# Patient Record
Sex: Male | Born: 1985 | Race: Black or African American | Hispanic: No | Marital: Single | Smoking: Never smoker
Health system: Southern US, Community
[De-identification: ages and names within clinical notes are randomized; demographics above are authoritative.]

## PROBLEM LIST (undated history)

## (undated) DIAGNOSIS — J45909 Unspecified asthma, uncomplicated: Secondary | ICD-10-CM

---

## 2021-04-12 ENCOUNTER — Encounter (HOSPITAL_COMMUNITY): Payer: Self-pay

## 2021-04-12 ENCOUNTER — Other Ambulatory Visit: Payer: Self-pay

## 2021-04-12 ENCOUNTER — Emergency Department (HOSPITAL_COMMUNITY)
Admission: EM | Admit: 2021-04-12 | Discharge: 2021-04-12 | Disposition: A | Discharge status: Home / Self Care | Payer: BC Managed Care – PPO | Attending: Emergency Medicine | Admitting: Emergency Medicine

## 2021-04-12 ENCOUNTER — Emergency Department (HOSPITAL_COMMUNITY): Payer: BC Managed Care – PPO

## 2021-04-12 DIAGNOSIS — R030 Elevated blood-pressure reading, without diagnosis of hypertension: Secondary | ICD-10-CM

## 2021-04-12 DIAGNOSIS — R0981 Nasal congestion: Secondary | ICD-10-CM | POA: Insufficient documentation

## 2021-04-12 DIAGNOSIS — R059 Cough, unspecified: Secondary | ICD-10-CM | POA: Diagnosis not present

## 2021-04-12 DIAGNOSIS — R0602 Shortness of breath: Secondary | ICD-10-CM | POA: Insufficient documentation

## 2021-04-12 DIAGNOSIS — R062 Wheezing: Secondary | ICD-10-CM | POA: Insufficient documentation

## 2021-04-12 DIAGNOSIS — J4521 Mild intermittent asthma with (acute) exacerbation: Secondary | ICD-10-CM

## 2021-04-12 HISTORY — DX: Unspecified asthma, uncomplicated: J45.909

## 2021-04-12 MED ORDER — ALBUTEROL SULFATE HFA 108 (90 BASE) MCG/ACT IN AERS: 2.0000 | INHALATION_SPRAY | RESPIRATORY_TRACT | Status: DC | PRN

## 2021-04-12 MED ORDER — PREDNISONE 20 MG PO TABS: 60.0000 mg | ORAL_TABLET | Freq: Every day | ORAL | 0 refills | Status: AC

## 2021-04-12 MED ORDER — PREDNISONE 20 MG PO TABS
60.0000 mg | ORAL_TABLET | Freq: Once | ORAL | Status: AC
  Administered 2021-04-12: 60 mg via ORAL
  Filled 2021-04-12: qty 3

## 2021-04-12 MED ORDER — IPRATROPIUM BROMIDE HFA 17 MCG/ACT IN AERS
2.0000 | INHALATION_SPRAY | Freq: Once | RESPIRATORY_TRACT | Status: AC
  Administered 2021-04-12: 2 via RESPIRATORY_TRACT
  Filled 2021-04-12: qty 12.9

## 2021-04-12 MED ORDER — ALBUTEROL SULFATE HFA 108 (90 BASE) MCG/ACT IN AERS
4.0000 | INHALATION_SPRAY | Freq: Once | RESPIRATORY_TRACT | Status: AC
  Administered 2021-04-12: 4 via RESPIRATORY_TRACT
  Filled 2021-04-12: qty 6.7

## 2021-04-12 MED ORDER — ALBUTEROL SULFATE HFA 108 (90 BASE) MCG/ACT IN AERS: 2.0000 | INHALATION_SPRAY | RESPIRATORY_TRACT | 1 refills | Status: AC | PRN

## 2021-04-12 NOTE — ED Triage Notes (Signed)
Pt presents with SOB x2 weeks, chest discomfort, using his inhaler w/no relief. Pt reports hx of seasonal allergies with this time every year.

## 2021-04-12 NOTE — ED Provider Notes (Signed)
[facility] EMERGENCY DEPARTMENT Provider Note   CSN: [id number] Arrival date & time: 04/12/21  1030     History Chief Complaint  Patient presents with  . Shortness of Breath    Shawn Singleton is a 35 y.o. male.  Patient with hx asthma, with wheezing, sob, and increased inhaler use. Symptoms acute onset, moderate, persistent, felt worse today. Patient normally uses inhaler prn. States this time of year, w pollen, etc, it usually makes wheezing worse. +smokes thc. Occasional non prod cough. No sore throat or runny nose. No fever or chills. No specific known ill contacts. Denies chest pain. No leg pain or swelling.   The history is provided by the patient.  Shortness of Breath Associated symptoms: cough and wheezing   Associated symptoms: no abdominal pain, no chest pain, no fever, no headaches, no neck pain, no rash, no sore throat and no vomiting        Past Medical History:  Diagnosis Date  . Asthma     There are no problems to display for this patient.   History reviewed. No pertinent surgical history.     History reviewed. No pertinent family history.  Social History   Tobacco Use  . Smoking status: Never Smoker  . Smokeless tobacco: Never Used  Substance Use Topics  . Alcohol use: Not Currently  . Drug use: Yes    Types: Marijuana    Home Medications Prior to Admission medications   Not on File    Allergies    Patient has no known allergies.  Review of Systems   Review of Systems  Constitutional: Negative for chills and fever.  HENT: Positive for congestion. Negative for sore throat.   Eyes: Negative for redness.  Respiratory: Positive for cough, shortness of breath and wheezing.   Cardiovascular: Negative for chest pain and leg swelling.  Gastrointestinal: Negative for abdominal pain and vomiting.  Genitourinary: Negative for flank pain.  Musculoskeletal: Negative for neck pain and neck stiffness.  Skin: Negative for rash.   Neurological: Negative for headaches.  Hematological: Does not bruise/bleed easily.  Psychiatric/Behavioral: Negative for confusion.    Physical Exam Updated Vital Signs BP (!) 144/99 (BP Location: Left Arm)   Pulse 85   Temp 97.7 F (36.5 C) (Oral)   Resp 18   SpO2 100%   Physical Exam Vitals and nursing note reviewed.  Constitutional:      Appearance: Normal appearance. He is well-developed.  HENT:     Head: Atraumatic.     Nose: Congestion present.     Mouth/Throat:     Mouth: Mucous membranes are moist.     Pharynx: Oropharynx is clear.  Eyes:     General: No scleral icterus.    Conjunctiva/sclera: Conjunctivae normal.  Neck:     Trachea: No tracheal deviation.     Comments: No stiffness or rigidity Cardiovascular:     Rate and Rhythm: Normal rate and regular rhythm.     Pulses: Normal pulses.     Heart sounds: Normal heart sounds. No murmur heard. No friction rub. No gallop.   Pulmonary:     Effort: Pulmonary effort is normal. No accessory muscle usage or respiratory distress.     Breath sounds: Wheezing present.  Abdominal:     General: There is no distension.     Tenderness: There is no abdominal tenderness.  Genitourinary:    Comments: No cva tenderness. Musculoskeletal:        General: No swelling or tenderness.  Cervical back: Normal range of motion and neck supple. No rigidity.     Right lower leg: No edema.     Left lower leg: No edema.  Skin:    General: Skin is warm and dry.     Findings: No rash.  Neurological:     Mental Status: He is alert.     Comments: Alert, speech clear.   Psychiatric:        Mood and Affect: Mood normal.     ED Results / Procedures / Treatments   Labs (all labs ordered are listed, but only abnormal results are displayed) Labs Reviewed - No data to display  EKG EKG Interpretation  Date/Time:  Tuesday Apr 12 2021 10:31:53 EDT Ventricular Rate:  74 PR Interval:  146 QRS Duration: 96 QT Interval:  354 QTC  Calculation: 392 R Axis:   68 Text Interpretation: Normal sinus rhythm Nonspecific T wave abnormality Confirmed by Cathren Laine ([id number]) on 04/12/2021 10:46:28 AM   Radiology DG Chest Portable 1 View  Result Date: 04/12/2021 CLINICAL DATA:  Shortness of breath for 2 weeks.  Chest discomfort. EXAM: PORTABLE CHEST 1 VIEW COMPARISON:  None. FINDINGS: The heart size and mediastinal contours are within normal limits. Both lungs are clear. The visualized skeletal structures are unremarkable. IMPRESSION: No active disease. Electronically Signed   By: Norva Pavlov M.D.   On: 04/12/2021 11:19    Procedures Procedures   Medications Ordered in ED Medications  albuterol (VENTOLIN HFA) 108 (90 Base) MCG/ACT inhaler 2 puff (has no administration in time range)  predniSONE (DELTASONE) tablet 60 mg (60 mg Oral Given 04/12/21 1140)  albuterol (VENTOLIN HFA) 108 (90 Base) MCG/ACT inhaler 4 puff (4 puffs Inhalation Given 04/12/21 1140)  ipratropium (ATROVENT HFA) inhaler 2 puff (2 puffs Inhalation Given 04/12/21 1140)    ED Course  I have reviewed the triage vital signs and the nursing notes.  Pertinent labs & imaging results that were available during my care of the patient were reviewed by me and considered in my medical decision making (see chart for details).    MDM Rules/Calculators/A&P                         Albuterol and atrovent treatment given. Prednisone po.   Reviewed nursing notes and prior charts for additional history.   Xrays reviewed/interpreted by me - no pna.   Recheck pt - breathing comfortably, no distress, o2 sats 100%.   Pt currently appears stable for d/c.      Final Clinical Impression(s) / ED Diagnoses Final diagnoses:  None    Rx / DC Orders ED Discharge Orders    None       Cathren Laine, MD 04/12/21 1148

## 2021-04-12 NOTE — Discharge Instructions (Addendum)
It was our pleasure to provide your ER care today - we hope that you feel better.  Take prednisone as prescribed. Use albuterol inhaler 2 puffs every 3-4 hours as need. Avoid smoking.   Your blood pressure is high today - limit salt intake, follow heart healthy eating plan, and follow up with primary care doctor in 1 - 2 weeks.   Return to ER if worse, new symptoms, increased trouble breathing, fevers, chest pain, or other concern.

## 2022-04-16 IMAGING — DX DG CHEST 1V PORT
1 series · 1 of 1 positions shown · non-contrast
Comparison: None.

CLINICAL DATA: Shortness of breath for 2 weeks.  Chest discomfort.

EXAM:
PORTABLE CHEST 1 VIEW

[chest ap]
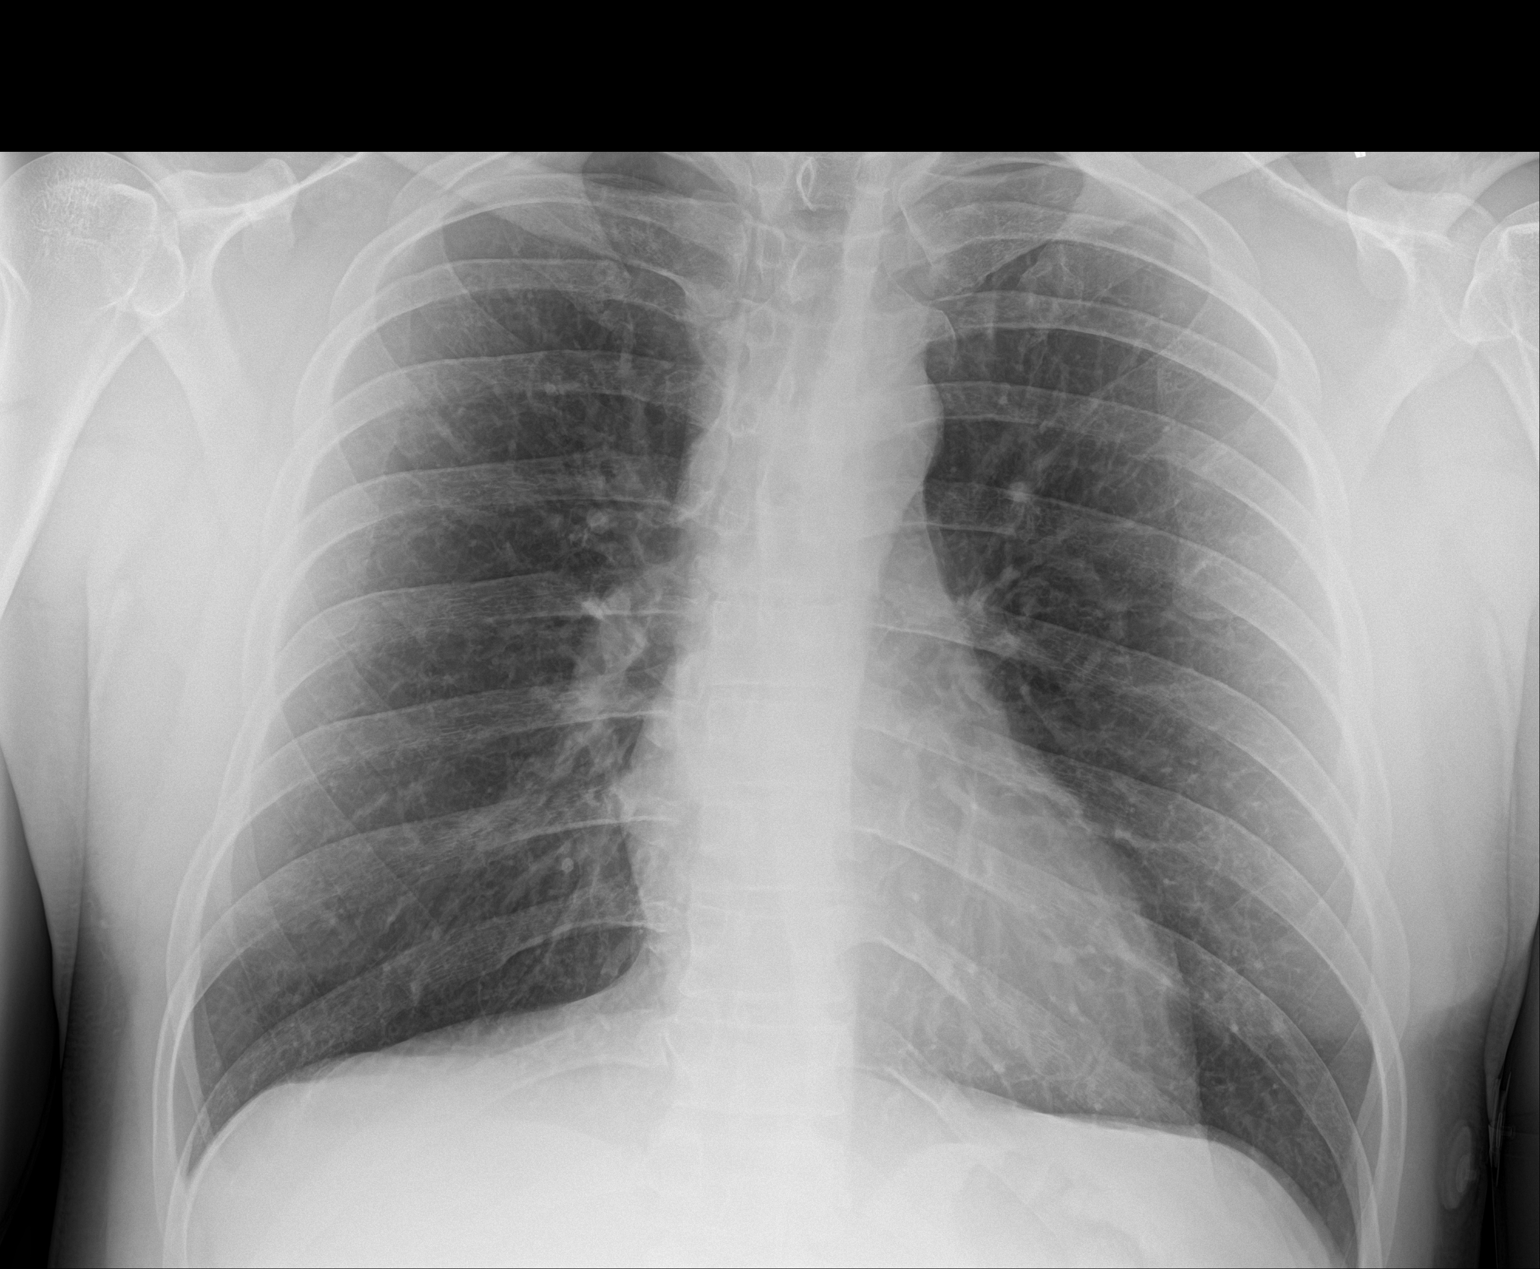

[1 of 1 positions shown; findings below may reference images not displayed]

FINDINGS: The heart size and mediastinal contours are within normal limits.
Both lungs are clear. The visualized skeletal structures are
unremarkable.
IMPRESSION: No active disease.
